# Patient Record
Sex: Female | Born: 1955 | Race: Black or African American | Hispanic: No | Marital: Single | Smoking: Current some day smoker
Health system: Southern US, Community
[De-identification: ages and names within clinical notes are randomized; demographics above are authoritative.]

---

## 2018-08-13 ENCOUNTER — Emergency Department (HOSPITAL_COMMUNITY): Payer: PRIVATE HEALTH INSURANCE

## 2018-08-13 ENCOUNTER — Other Ambulatory Visit: Payer: Self-pay

## 2018-08-13 ENCOUNTER — Encounter (HOSPITAL_COMMUNITY): Payer: Self-pay

## 2018-08-13 ENCOUNTER — Emergency Department (HOSPITAL_COMMUNITY)
Admission: EM | Admit: 2018-08-13 | Discharge: 2018-08-13 | Disposition: A | Discharge status: Home / Self Care | Payer: PRIVATE HEALTH INSURANCE | Attending: Emergency Medicine | Admitting: Emergency Medicine

## 2018-08-13 DIAGNOSIS — S82832A Other fracture of upper and lower end of left fibula, initial encounter for closed fracture: Secondary | ICD-10-CM | POA: Insufficient documentation

## 2018-08-13 DIAGNOSIS — F17200 Nicotine dependence, unspecified, uncomplicated: Secondary | ICD-10-CM | POA: Diagnosis not present

## 2018-08-13 DIAGNOSIS — W109XXA Fall (on) (from) unspecified stairs and steps, initial encounter: Secondary | ICD-10-CM | POA: Diagnosis not present

## 2018-08-13 DIAGNOSIS — Z79899 Other long term (current) drug therapy: Secondary | ICD-10-CM | POA: Diagnosis not present

## 2018-08-13 DIAGNOSIS — Y999 Unspecified external cause status: Secondary | ICD-10-CM | POA: Insufficient documentation

## 2018-08-13 DIAGNOSIS — Y929 Unspecified place or not applicable: Secondary | ICD-10-CM | POA: Insufficient documentation

## 2018-08-13 DIAGNOSIS — S8992XA Unspecified injury of left lower leg, initial encounter: Secondary | ICD-10-CM | POA: Diagnosis present

## 2018-08-13 DIAGNOSIS — Y939 Activity, unspecified: Secondary | ICD-10-CM | POA: Diagnosis not present

## 2018-08-13 MED ORDER — ACETAMINOPHEN ER 650 MG PO TBCR: 650.0000 mg | EXTENDED_RELEASE_TABLET | Freq: Three times a day (TID) | ORAL | 0 refills | Status: AC | PRN

## 2018-08-13 MED ORDER — HYDROCODONE-ACETAMINOPHEN 5-325 MG PO TABS: 1.0000 | ORAL_TABLET | Freq: Three times a day (TID) | ORAL | 0 refills | Status: AC | PRN

## 2018-08-13 MED ORDER — OXYCODONE-ACETAMINOPHEN 5-325 MG PO TABS
1.0000 | ORAL_TABLET | Freq: Once | ORAL | Status: AC
  Administered 2018-08-13: 1 via ORAL
  Filled 2018-08-13: qty 1

## 2018-08-13 NOTE — ED Notes (Signed)
Assisted pt to restroom using wheelchair.

## 2018-08-13 NOTE — ED Provider Notes (Signed)
[facility]-EMERGENCY DEPT Provider Note   CSN: [id number] Arrival date & time: 08/13/18  1012     History   Chief Complaint Chief Complaint  Patient presents with  . Ankle Injury  . Fall    HPI Deborah Richardson is a 62 y.o. female.  HPI  62 year old female comes in with chief complaint of fall.  Patient reports that she was walking downstairs when she tripped and fell.  Patient immediately started having pain in her right ankle with swelling, and she was unable to bear weight.  She did not strike her head and has no headaches, nausea, vomiting, seizures, loss of consciousness or new visual complains, weakness, numbness, dizziness.  He also denies any neck pain.Marland Kitchen.   History reviewed. No pertinent past medical history.  There are no active problems to display for this patient.   History reviewed. No pertinent surgical history.   OB History   No obstetric history on file.      Home Medications    Prior to Admission medications   Medication Sig Start Date End Date Taking? Authorizing Provider  acetaminophen (TYLENOL 8 HOUR) 650 MG CR tablet Take 1 tablet (650 mg total) by mouth every 8 (eight) hours as needed. 08/13/18   Derwood KaplanNanavati, Vale Mousseau, MD  HYDROcodone-acetaminophen (NORCO/VICODIN) 5-325 MG tablet Take 1 tablet by mouth every 8 (eight) hours as needed for up to 3 days for severe pain. 08/13/18 08/16/18  Derwood KaplanNanavati, Aahana Elza, MD    Family History History reviewed. No pertinent family history.  Social History Social History   Tobacco Use  . Smoking status: Current Some Day Smoker  . Smokeless tobacco: Never Used  Substance Use Topics  . Alcohol use: Not on file  . Drug use: Not on file     Allergies   Patient has no allergy information on record.   Review of Systems Review of Systems  Constitutional: Positive for activity change.  Respiratory: Negative for shortness of breath.   Cardiovascular: Negative for chest pain.  Skin: Negative  for wound.  Neurological: Negative for headaches.  Hematological: Does not bruise/bleed easily.     Physical Exam Updated Vital Signs BP (!) 222/96   Pulse 65   Temp 97.9 F (36.6 C) (Oral)   Resp 15   Ht 5\' 7"  (1.702 m)   Wt 77.1 kg   SpO2 98%   BMI 26.63 kg/m   Physical Exam Vitals signs and nursing note reviewed.  Constitutional:      Appearance: She is well-developed.  HENT:     Head: Normocephalic and atraumatic.  Neck:     Musculoskeletal: Normal range of motion and neck supple.  Cardiovascular:     Rate and Rhythm: Normal rate.  Pulmonary:     Effort: Pulmonary effort is normal.  Abdominal:     General: Bowel sounds are normal.  Skin:    General: Skin is warm and dry.  Neurological:     Mental Status: She is alert and oriented to person, place, and time.      ED Treatments / Results  Labs (all labs ordered are listed, but only abnormal results are displayed) Labs Reviewed - No data to display  EKG None  Radiology Dg Ankle Complete Right  Result Date: 08/13/2018 CLINICAL DATA:  Recent fall with ankle pain, initial encounter EXAM: RIGHT ANKLE - COMPLETE 3+ VIEW COMPARISON:  None. FINDINGS: Considerable soft tissue swelling is noted laterally. There is an oblique fracture identified through the distal fibular metaphysis with only  minimal displacement identified. No other fracture is seen. IMPRESSION: Distal fibular fracture with associated soft tissue swelling Electronically Signed   By: Alcide CleverMark  Lukens M.D.   On: 08/13/2018 11:30    Procedures Procedures (including critical care time)  Medications Ordered in ED Medications  oxyCODONE-acetaminophen (PERCOCET/ROXICET) 5-325 MG per tablet 1 tablet (1 tablet Oral Given 08/13/18 1126)     Initial Impression / Assessment and Plan / ED Course  I have reviewed the triage vital signs and the nursing notes.  Pertinent labs & imaging results that were available during my care of the patient were reviewed by  me and considered in my medical decision making (see chart for details).     62 year old female comes in a chief complaint of fall.  She is noted to have ankle fracture on the x-rays.  She is neurovascularly intact.  I was able to clear the brain and C-spine clinically. We will provide her with crutches and posterior splint with stirrups have been placed.  Patient advised to follow-up with orthopedics and main nonweightbearing  Final Clinical Impressions(s) / ED Diagnoses   Final diagnoses:  Closed fracture of distal end of left fibula, unspecified fracture morphology, initial encounter    ED Discharge Orders         Ordered    HYDROcodone-acetaminophen (NORCO/VICODIN) 5-325 MG tablet  Every 8 hours PRN     08/13/18 1245    acetaminophen (TYLENOL 8 HOUR) 650 MG CR tablet  Every 8 hours PRN     08/13/18 1245           Derwood KaplanNanavati, Delyla Sandeen, MD 08/13/18 1330

## 2018-08-13 NOTE — ED Notes (Signed)
Ice placed on injured extremity.

## 2018-08-13 NOTE — ED Triage Notes (Signed)
Pt reports fell while coming down steps, injured ankle and fell to knees

## 2018-08-13 NOTE — Discharge Instructions (Signed)
The results of the x-ray show that you have a fracture of your right ankle. We recommend that you follow-up with the orthopedist in 1 week.  Please do not bear any weight and use crutches when moving around.

## 2019-11-24 IMAGING — CR DG ANKLE COMPLETE 3+V*R*
3 series · 3 of 3 positions shown · non-contrast
Comparison: None.

CLINICAL DATA: Recent fall with ankle pain, initial encounter

EXAM:
RIGHT ANKLE - COMPLETE 3+ VIEW

[x ankle ap right]
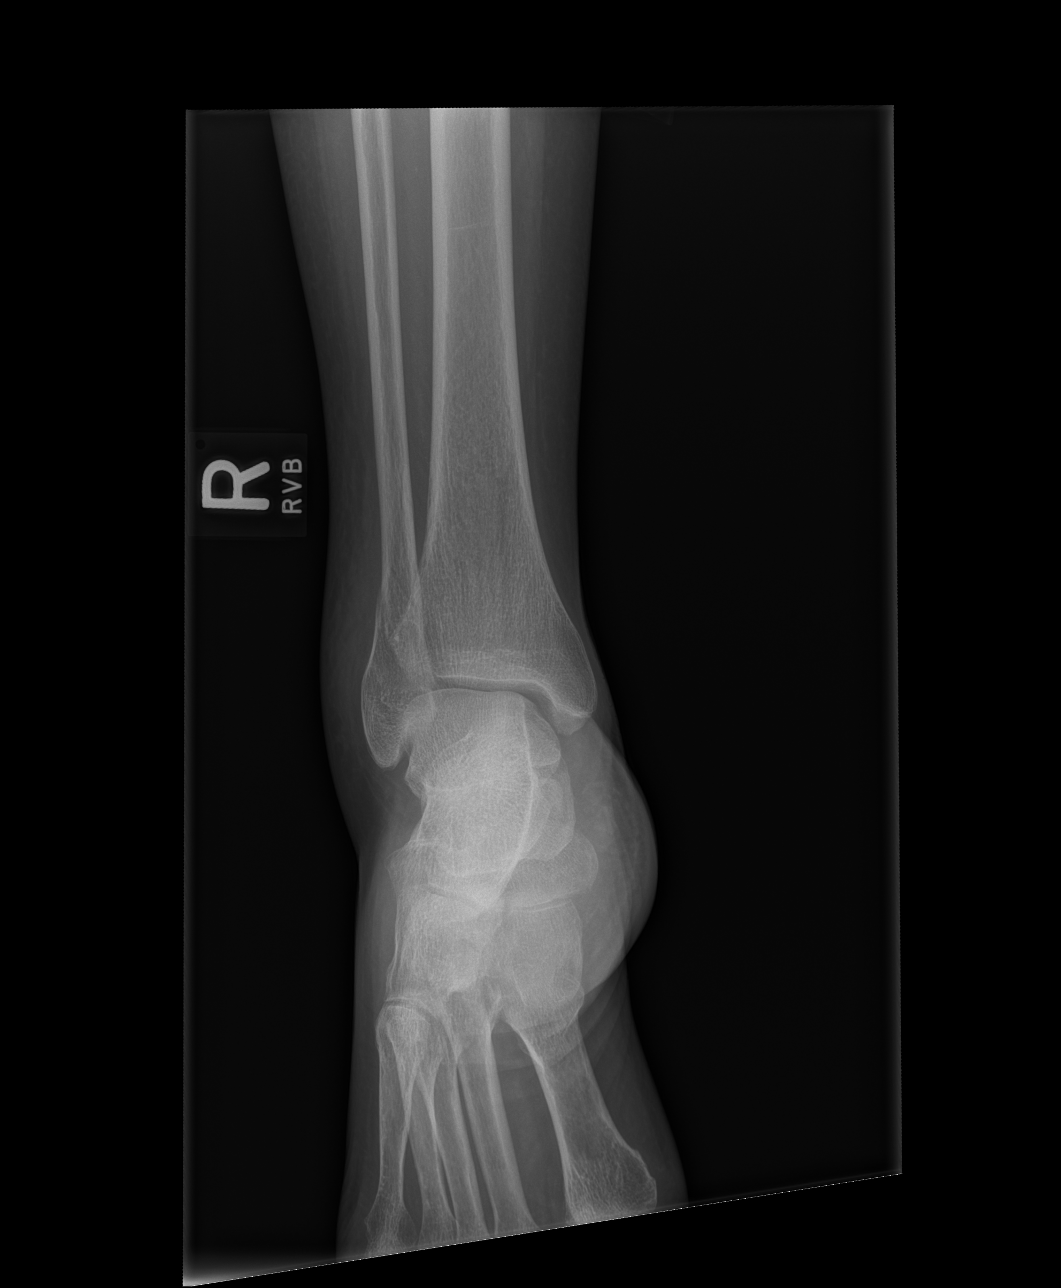

[x ankle obl right]
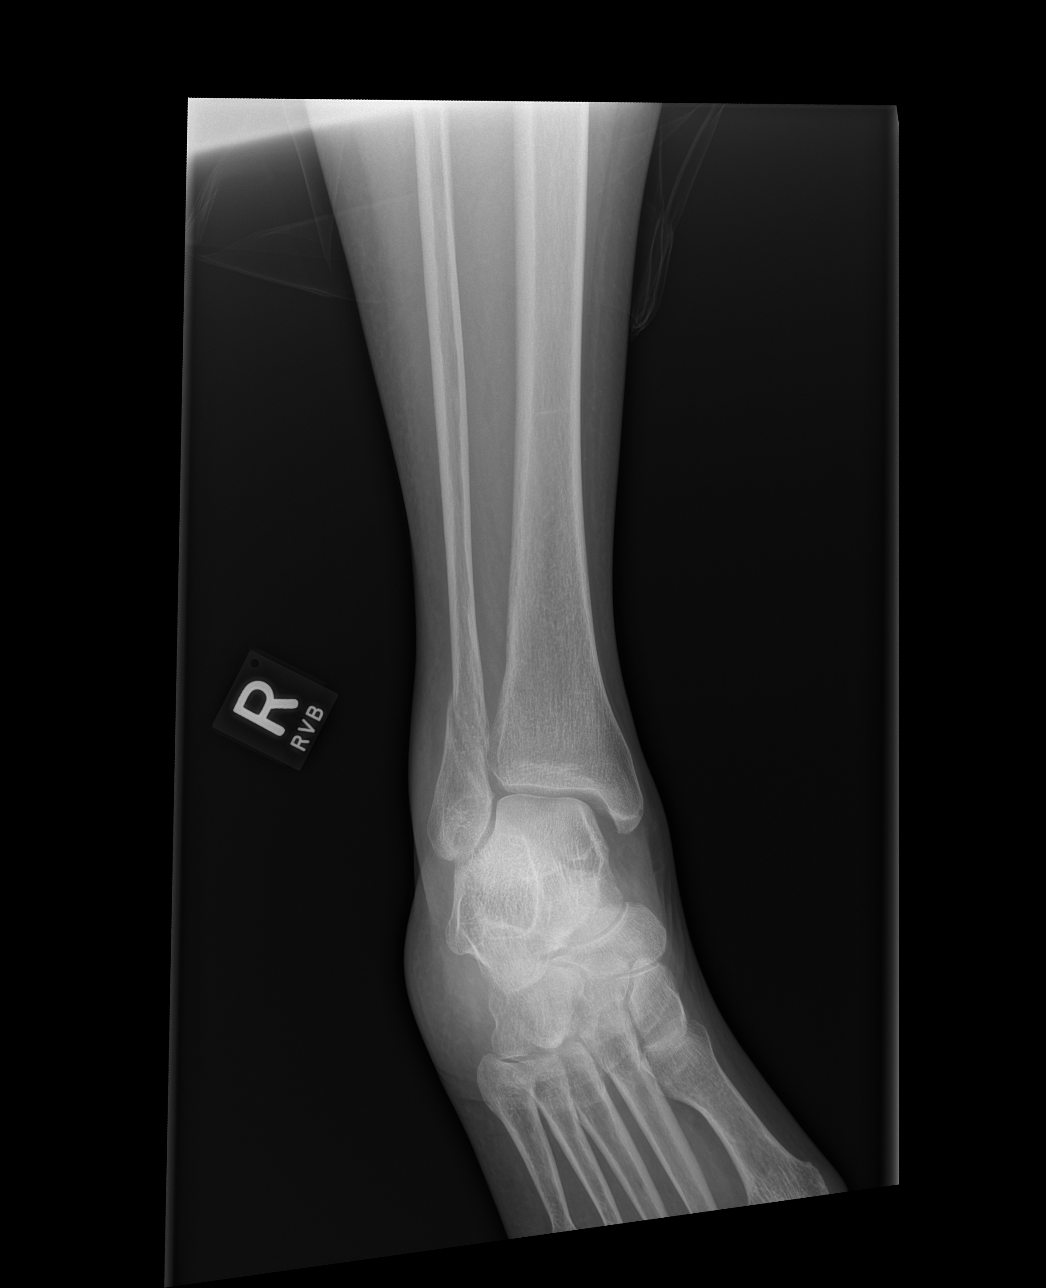

[x ankle lat right]
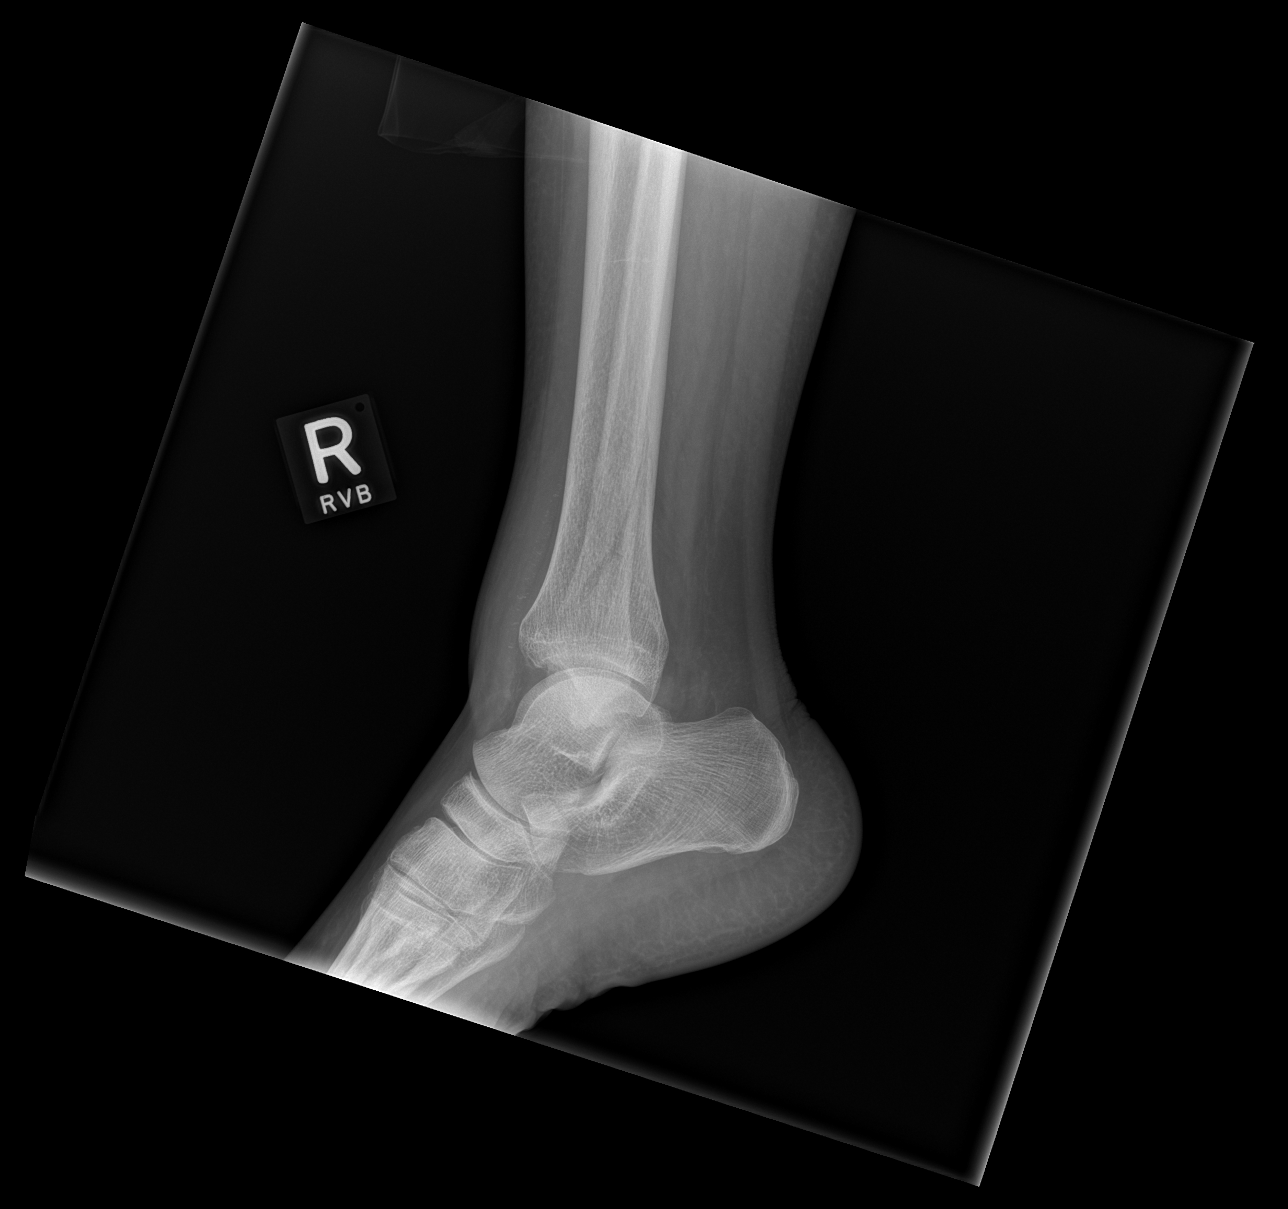

[3 of 3 positions shown; findings below may reference images not displayed]

FINDINGS: Considerable soft tissue swelling is noted laterally. There is an
oblique fracture identified through the distal fibular metaphysis
with only minimal displacement identified. No other fracture is
seen.
IMPRESSION: Distal fibular fracture with associated soft tissue swelling
